# Patient Record
Sex: Male | Born: 1965 | Race: Black or African American | Hispanic: No | Marital: Married | Smoking: Current some day smoker
Health system: Southern US, Community
[De-identification: ages and names within clinical notes are randomized; demographics above are authoritative.]

## PROBLEM LIST (undated history)

## (undated) DIAGNOSIS — E7800 Pure hypercholesterolemia, unspecified: Secondary | ICD-10-CM

## (undated) DIAGNOSIS — J45909 Unspecified asthma, uncomplicated: Secondary | ICD-10-CM

## (undated) DIAGNOSIS — E785 Hyperlipidemia, unspecified: Secondary | ICD-10-CM

## (undated) HISTORY — DX: Unspecified asthma, uncomplicated: J45.909

## (undated) HISTORY — DX: Hyperlipidemia, unspecified: E78.5

---

## 2000-10-21 ENCOUNTER — Emergency Department (HOSPITAL_COMMUNITY): Admission: EM | Admit: 2000-10-21 | Discharge: 2000-10-21 | Payer: Self-pay | Admitting: Emergency Medicine

## 2001-01-21 ENCOUNTER — Emergency Department (HOSPITAL_COMMUNITY): Admission: EM | Admit: 2001-01-21 | Discharge: 2001-01-21 | Payer: Self-pay | Admitting: Emergency Medicine

## 2001-06-21 ENCOUNTER — Encounter: Payer: Self-pay | Admitting: Emergency Medicine

## 2001-06-21 ENCOUNTER — Emergency Department (HOSPITAL_COMMUNITY): Admission: EM | Admit: 2001-06-21 | Discharge: 2001-06-21 | Payer: Self-pay | Admitting: Emergency Medicine

## 2001-08-13 ENCOUNTER — Emergency Department (HOSPITAL_COMMUNITY): Admission: EM | Admit: 2001-08-13 | Discharge: 2001-08-13 | Payer: Self-pay | Admitting: Emergency Medicine

## 2002-04-22 ENCOUNTER — Emergency Department (HOSPITAL_COMMUNITY): Admission: EM | Admit: 2002-04-22 | Discharge: 2002-04-22 | Payer: Self-pay | Admitting: Emergency Medicine

## 2005-08-08 ENCOUNTER — Emergency Department (HOSPITAL_COMMUNITY): Admission: EM | Admit: 2005-08-08 | Discharge: 2005-08-08 | Payer: Self-pay | Admitting: Emergency Medicine

## 2006-10-03 ENCOUNTER — Emergency Department (HOSPITAL_COMMUNITY): Admission: EM | Admit: 2006-10-03 | Discharge: 2006-10-03 | Payer: Self-pay | Admitting: Emergency Medicine

## 2010-01-16 ENCOUNTER — Ambulatory Visit
Admission: RE | Admit: 2010-01-16 | Discharge: 2010-01-16 | Payer: Self-pay | Source: Home / Self Care | Attending: Pulmonary Disease | Admitting: Pulmonary Disease

## 2010-01-16 DIAGNOSIS — J45909 Unspecified asthma, uncomplicated: Secondary | ICD-10-CM | POA: Insufficient documentation

## 2010-02-13 NOTE — Assessment & Plan Note (Signed)
Summary: asthma/jd   Copy to:  Self referral  CC:  Asthma evaluation.  History of Present Illness: 45 yo male for evaluation of asthma.  He has suffered from asthma his whole life.  He gets flairs whenever he gets a cold.  He had a cold in October, and hasn't recovered since.  He has to go to the ER a lot, and frequently gets course of prednisone.  He does not have a regular doctor.  He was last on prednisone about 7 months ago.  He has been using an old albuterol inhaler.  This helps, but does not last.  He has been on several other inhalers before, but does not remember what these were.  He does not have problem with allergies.  His sinuses have been okay.  He has not had problem with his throat, and denies gland swelling.  He denies reflux or abdominal symptoms.  He has not been having fever, or hemoptysis.  He denies skin rash.  He does not have any food or medication allergies.    He occasional smokes a cigar.  He works driving a Chief Executive Officer.  He denies history of pneumonia or TB.  He does not have any animal exposure.  He has not had any sick exposures.  He doesn't remember when he last had a chest xray or breathing tests.  Preventive Screening-Counseling & Management  Alcohol-Tobacco     Alcohol drinks/day: <1     Cigars/week: 1  Current Medications (verified): 1)  Ventolin Hfa 108 (90 Base) Mcg/act Aers (Albuterol Sulfate) .Marland Kitchen.. 1-2 Puffs As Needed 2)  Vitamin C 500 Mg Tabs (Ascorbic Acid) .Marland Kitchen.. 1 By Mouth Daily 3)  Multivitamins  Tabs (Multiple Vitamin) .Marland Kitchen.. 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  The patient denies productive cough, coughing up blood, irregular heartbeats, acid heartburn, loss of appetite, weight change, abdominal pain, difficulty swallowing, sore throat, tooth/dental problems, headaches, nasal congestion/difficulty breathing through nose, sneezing, itching, ear ache, anxiety, depression, hand/feet swelling, joint stiffness or pain, rash, change in color of mucus, and fever.    Vital Signs:  Patient profile:   45 year old male Height:      70 inches (177.80 cm) Weight:      211 pounds (95.91 kg) BMI:     30.38 O2 Sat:      92 % on Room air Temp:     98.3 degrees F (36.83 degrees C) oral Pulse rate:   78 / minute BP sitting:   124 / 78  (left arm) Cuff size:   large  Vitals Entered By: Michel Bickers CMA (January 16, 2010 4:48 PM)  O2 Sat at Rest %:  92 O2 Flow:  Room air CC: Asthma evaluation Is Patient Diabetic? No Comments Medications reviewed with patient Michel Bickers CMA  January 16, 2010 4:49 PM   Physical Exam  General:  normal appearance and dyspneic.   Eyes:  PERRLA and EOMI.   Ears:  TMs intact and clear with normal canals Nose:  clear nasal discharge.   Mouth:  2+ tonsills, mild erythema, no exudate Neck:  no JVD.   Chest Wall:  no deformities noted Lungs:  prolonged exhalation, diffuse b/l exp wheeze improved after nebulizer treatment Heart:  regular rate and rhythm, S1, S2 without murmurs, rubs, gallops, or clicks Abdomen:  bowel sounds positive; abdomen soft and non-tender without masses, or organomegaly Msk:  no deformity or scoliosis noted with normal posture Pulses:  pulses normal Extremities:  no clubbing, cyanosis, edema, or deformity noted Neurologic:  normal CN II-XII and strength normal.   Skin:  intact without lesions or rashes Cervical Nodes:  no significant adenopathy Psych:  alert and cooperative; normal mood and affect;  normal attention span and concentration   Impression & Recommendations:  Problem # 1:  ASTHMA (ICD-493.90) He has an extensive history of asthma with frequent exacerbations requiring emergency room visits.  He has intermittent tobacco use.  He has an acute exacerbation currently.  He had symptomatic and clinical improvement from nebulizer treatment today.    I will give him a short course of prednisone.  I will start him on Qvar.  He can use ventolin as needed.  I will also arrange for him to get a home nebulizer.  I explained to him that asthma is a controllable disorder provided he remains compliant with therapy and follow up.  Emphasized the need to avoid all tobacco products.  Also explained the adverse health effects of recurrent prednisone use.  At his next follow up will arrange for pulmonary function testing.  Will also need to address vaccinations at next visit.  Medications Added to Medication List This Visit: 1)  Qvar 80 Mcg/act Aers (Beclomethasone dipropionate) .... Two puffs two times a day 2)  Ventolin Hfa 108 (90 Base) Mcg/act Aers (Albuterol sulfate) .Marland Kitchen.. 1-2 puffs as needed 3)  Albuterol Sulfate (2.5 Mg/68ml) 0.083% Nebu (Albuterol sulfate) .... One vial nebulized four times per day as needed 4)  Home Nebulizer  .... Use as directed 5)  Vitamin C 500 Mg Tabs (Ascorbic acid) .Marland Kitchen.. 1 by mouth daily 6)  Multivitamins Tabs (Multiple vitamin) .Marland Kitchen.. 1 by mouth daily 7)  Prednisone 10 Mg Tabs (Prednisone) .... 2 pills for 2 days, 1 pill for 2 days, 1/2 pill for 2 days  Complete Medication List: 1)  Qvar 80 Mcg/act Aers (Beclomethasone dipropionate) .... Two puffs two times a day 2)  Ventolin Hfa 108 (90 Base) Mcg/act Aers (Albuterol sulfate) .Marland Kitchen.. 1-2 puffs as needed 3)  Albuterol Sulfate (2.5 Mg/6ml) 0.083% Nebu (Albuterol sulfate) .... One vial nebulized four times per day as needed 4)  Home Nebulizer  .... Use as directed 5)  Vitamin C 500 Mg Tabs (Ascorbic acid) .Marland Kitchen.. 1 by mouth  daily 6)  Multivitamins Tabs (Multiple vitamin) .Marland Kitchen.. 1 by mouth daily 7)  Prednisone 10 Mg Tabs (Prednisone) .... 2 pills for 2 days, 1 pill for 2 days, 1/2 pill for 2 days  Other Orders: Nebulizer Tx (82956) Consultation Level IV (21308) DME Referral (DME)  Patient Instructions: 1)  Prednisone 10 mg pills: 2 pills for 2 days, 1 pill for 2 days, 1/2 pill for 2 days 2)  Qvar two puffs two times a day, and rinse mouth after using 3)  Ventolin two puffs up to four times per day as needed for cough, wheeze, chest congestion, or shortness of breath 4)  Will arrange for home nebulizer 5)  Albuterol one vial nebulized up to four times per day as needed for cough,  wheeze, chest congestion, or shortness of breath 6)  Follow up in 4 to 6 weeks Prescriptions: HOME NEBULIZER use as directed  #1 x 0   Entered and Authorized by:   Coralyn Helling MD   Signed by:   Coralyn Helling MD on 01/16/2010   Method used:   Print then Give to Patient   RxID:   [id number] PREDNISONE 10 MG TABS (PREDNISONE) 2 pills for 2 days, 1 pill for 2 days, 1/2 pill for 2 days  #7 x 0   Entered and Authorized by:   Coralyn Helling MD   Signed by:   Coralyn Helling MD on 01/16/2010   Method used:   Print then Give to Patient   RxID:   [id number] VENTOLIN HFA 108 (90 BASE) MCG/ACT AERS (ALBUTEROL SULFATE) 1-2 puffs as needed  #0 x 0   Entered and Authorized by:   Coralyn Helling MD   Signed by:   Coralyn Helling MD on 01/16/2010   Method used:   Print then Give to Patient   RxID:   [id number] ALBUTEROL SULFATE (2.5 MG/3ML) 0.083% NEBU (ALBUTEROL SULFATE) one vial nebulized four times per day as needed  #120 x 6   Entered and Authorized by:   Coralyn Helling MD   Signed by:   Coralyn Helling MD on 01/16/2010   Method used:   Print then Give to Patient   RxID:   [id number] QVAR 80 MCG/ACT AERS (BECLOMETHASONE DIPROPIONATE) two puffs two times a day  #1 x 6   Entered and Authorized by:   Coralyn Helling MD   Signed by:   Coralyn Helling  MD on 01/16/2010   Method used:   Print then Give to Patient   RxID:   [id number]    Medication Administration  Medication # 1:    Medication: Xopenex 1.25mg     Diagnosis: ASTHMA (ICD-493.90)    Route: inhaled    Exp Date: 08/2010    Lot #: [id number]    Mfr: Sepracor    Patient tolerated medication without complications    Given by: Michel Bickers CMA (January 16, 2010 5:04 PM)  Orders Added: 1)  Nebulizer Tx [60630] 2)  Consultation Level IV [16010] 3)  DME Referral [DME]

## 2010-02-21 ENCOUNTER — Ambulatory Visit: Payer: Self-pay | Admitting: Pulmonary Disease

## 2010-02-24 ENCOUNTER — Telehealth: Payer: Self-pay | Admitting: Pulmonary Disease

## 2010-03-05 NOTE — Progress Notes (Signed)
Summary: nos appt  Phone Note Call from Patient   Caller: [email or web address]  Call For: Granger Chui Summary of Call: In ref to nos from 2/10 pt states he will call to rsc. Initial call taken by: Darletta Moll,  February 24, 2010 9:28 AM

## 2010-03-20 ENCOUNTER — Telehealth (INDEPENDENT_AMBULATORY_CARE_PROVIDER_SITE_OTHER): Payer: Self-pay | Admitting: *Deleted

## 2010-03-21 ENCOUNTER — Encounter: Payer: Self-pay | Admitting: Pulmonary Disease

## 2010-03-25 NOTE — Progress Notes (Signed)
Summary: prednisone  Phone Note Call from Patient Call back at [phone]   Caller: Patient Call For: sood Reason for Call: Talk to Nurse Summary of Call: Patient calling asking why prednisone rx was denied. Initial call taken by: Lehman Prom,  March 20, 2010 8:43 AM  Follow-up for Phone Call        Prednisone refill denied because it was given as a tapering dose in Jan 2012. Pt says he is wheezing more and his inhalers are not helping for any length of time. SOB come and goes. Pt informed VS is not in the office and he would need OV w/ another provider if needing RX for the Prednisone. Pt was offered appt today w/ TP @ 4:30 or OV today w/ KC @ 11:15. Pt refused and said he would have to talk with employer to see if he could get out of work. Pt stated he would callback once he has spoke with employer. Follow-up by: Michel Bickers CMA,  March 20, 2010 9:26 AM

## 2010-04-01 ENCOUNTER — Ambulatory Visit: Payer: Self-pay | Admitting: Pulmonary Disease

## 2010-06-05 ENCOUNTER — Other Ambulatory Visit: Payer: Self-pay | Admitting: Pulmonary Disease

## 2010-10-17 ENCOUNTER — Telehealth: Payer: Self-pay | Admitting: Pulmonary Disease

## 2010-10-17 ENCOUNTER — Other Ambulatory Visit: Payer: Self-pay | Admitting: Pulmonary Disease

## 2010-10-17 NOTE — Telephone Encounter (Signed)
Pt aware rx was sent and apt made for 10/29 at 9:15 for ov

## 2010-11-10 ENCOUNTER — Ambulatory Visit: Payer: Self-pay | Admitting: Pulmonary Disease

## 2011-01-23 ENCOUNTER — Other Ambulatory Visit: Payer: Self-pay | Admitting: Pulmonary Disease

## 2011-03-20 ENCOUNTER — Other Ambulatory Visit: Payer: Self-pay | Admitting: Pulmonary Disease

## 2012-06-13 ENCOUNTER — Other Ambulatory Visit: Payer: Self-pay | Admitting: Pulmonary Disease

## 2012-06-30 ENCOUNTER — Other Ambulatory Visit: Payer: Self-pay | Admitting: Pulmonary Disease

## 2014-07-23 ENCOUNTER — Encounter: Payer: Self-pay | Admitting: Cardiovascular Disease

## 2014-08-15 ENCOUNTER — Ambulatory Visit (INDEPENDENT_AMBULATORY_CARE_PROVIDER_SITE_OTHER): Payer: 59 | Admitting: Cardiovascular Disease

## 2014-08-15 ENCOUNTER — Encounter: Payer: Self-pay | Admitting: Cardiovascular Disease

## 2014-08-15 VITALS — BP 128/80 | HR 62 | Ht 72.0 in | Wt 219.5 lb

## 2014-08-15 DIAGNOSIS — E785 Hyperlipidemia, unspecified: Secondary | ICD-10-CM | POA: Insufficient documentation

## 2014-08-15 DIAGNOSIS — Z79899 Other long term (current) drug therapy: Secondary | ICD-10-CM

## 2014-08-15 NOTE — Assessment & Plan Note (Signed)
Bryan Lewis has hyperlipidemia on Crestor followed by his PCP. At his request, I am going to check a lipid and liver profile

## 2014-08-15 NOTE — Progress Notes (Signed)
     08/15/2014 Bryan Lewis   10/09/1965  [id number]  Primary Physician Geraldo Pitter, MD Primary Cardiologist: Runell Gess MD [facility]   HPI:  Mr. Loh is a 49 year old fit-appearing married African-American male father of one daughter referred by Dr. Parke Simmers for cardiovascular evaluation. He works as a Charity fundraiser.he has a history of hyperlipidemia but otherwise no cardiovascular risk factors. No matter how tight or stroke. Denies chest pain or shortness of breath. He drinks socially. He is on Crestor for hypovolemia which Dr. Parke Simmers follows. He is here to be established because of his factors.   Current Outpatient Prescriptions  Medication Sig Dispense Refill  . Ascorbic Acid (VITAMIN C) 500 MG tablet Take 500 mg by mouth daily.      . beclomethasone (QVAR) 80 MCG/ACT inhaler 2 puffs 2 times a day     . Multiple Vitamin (MULTIVITAMIN PO) 1 by mouth daily     . rosuvastatin (CRESTOR) 10 MG tablet Take 10 mg by mouth daily.    Marland Kitchen albuterol (PROVENTIL) (2.5 MG/3ML) 0.083% nebulizer solution USE 1 VIAL IN NEBULIZER FOUR TIMES DAILY AS NEEDED 360 mL 1  . VENTOLIN HFA 108 (90 BASE) MCG/ACT inhaler USE 1 TO 2 PUFFS BY MOUTH AS DIRECTED AS NEEDED 1 Inhaler 0   No current facility-administered medications for this visit.    No Known Allergies  History   Social History  . Marital Status: Single    Spouse Name: N/A  . Number of Children: 1  . Years of Education: N/A   Occupational History  . Estate agent    Social History Main Topics  . Smoking status: Current Some Day Smoker    Types: Cigars  . Smokeless tobacco: Not on file  . Alcohol Use: 0.5 oz/week    1 drink(s) per week  . Drug Use: Not on file  . Sexual Activity: Not on file   Other Topics Concern  . Not on file   Social History Narrative     Review of Systems: General: negative for chills, fever, night sweats or weight changes.  Cardiovascular: negative for chest pain, dyspnea on  exertion, edema, orthopnea, palpitations, paroxysmal nocturnal dyspnea or shortness of breath Dermatological: negative for rash Respiratory: negative for cough or wheezing Urologic: negative for hematuria Abdominal: negative for nausea, vomiting, diarrhea, bright red blood per rectum, melena, or hematemesis Neurologic: negative for visual changes, syncope, or dizziness All other systems reviewed and are otherwise negative except as noted above.    Blood pressure 128/80, pulse 62, height 6' (1.829 m), weight 219 lb 8 oz (99.565 kg).  General appearance: alert and no distress Neck: no adenopathy, no carotid bruit, no JVD, supple, symmetrical, trachea midline and thyroid not enlarged, symmetric, no tenderness/mass/nodules Lungs: clear to auscultation bilaterally Heart: regular rate and rhythm, S1, S2 normal, no murmur, click, rub or gallop Extremities: extremities normal, atraumatic, no cyanosis or edema  EKG normal sinus rhythm at 62 without ST or T-wave changes. I personally reviewed this EKG  ASSESSMENT AND PLAN:   Hyperlipidemia Mr. Clauson has hyperlipidemia on Crestor followed by his PCP. At his request, I am going to check a lipid and liver profile      Runell Gess MD [facility], [facility] 08/15/2014 12:26 PM

## 2014-08-15 NOTE — Patient Instructions (Addendum)
Your physician recommends that you return for lab work at your earliest convenience - FASTING.  Dr Allyson Sabal will follow-up with you as needed.

## 2014-08-18 LAB — HEPATIC FUNCTION PANEL
ALK PHOS: 58 U/L (ref 40–115)
ALT: 29 U/L (ref 9–46)
AST: 23 U/L (ref 10–40)
Albumin: 4.2 g/dL (ref 3.6–5.1)
BILIRUBIN DIRECT: 0.1 mg/dL (ref <=0.2)
BILIRUBIN INDIRECT: 0.5 mg/dL (ref 0.2–1.2)
TOTAL PROTEIN: 7.8 g/dL (ref 6.1–8.1)
Total Bilirubin: 0.6 mg/dL (ref 0.2–1.2)

## 2014-08-18 LAB — LIPID PANEL
Cholesterol: 215 mg/dL — ABNORMAL HIGH (ref 125–200)
HDL: 67 mg/dL (ref >=40)
LDL CALC: 137 mg/dL — AB (ref <130)
TRIGLYCERIDES: 53 mg/dL (ref <150)
Total CHOL/HDL Ratio: 3.2 Ratio (ref <=5.0)
VLDL: 11 mg/dL (ref <30)

## 2014-08-21 ENCOUNTER — Encounter: Payer: Self-pay | Admitting: *Deleted

## 2014-08-21 ENCOUNTER — Other Ambulatory Visit: Payer: Self-pay | Admitting: *Deleted

## 2014-08-21 ENCOUNTER — Telehealth: Payer: Self-pay | Admitting: *Deleted

## 2014-08-21 DIAGNOSIS — E785 Hyperlipidemia, unspecified: Secondary | ICD-10-CM

## 2014-08-21 DIAGNOSIS — Z79899 Other long term (current) drug therapy: Secondary | ICD-10-CM

## 2014-08-21 MED ORDER — ROSUVASTATIN CALCIUM 10 MG PO TABS: 10.0000 mg | ORAL_TABLET | Freq: Every day | ORAL | Status: AC

## 2014-08-21 NOTE — Telephone Encounter (Signed)
Lab slip mailed.

## 2014-08-21 NOTE — Telephone Encounter (Signed)
-----   Message from Runell Gess, MD sent at 08/19/2014  4:31 PM EDT ----- Increase Crestor to 20 mg and re check

## 2014-09-19 ENCOUNTER — Telehealth: Payer: Self-pay | Admitting: *Deleted

## 2014-09-19 NOTE — Telephone Encounter (Signed)
Patient walk-in to get lab results from two weeks ago. Printed him out a copy and provided education regarding results, including medication regimen, lab follow up and diet. Patient verbalized understanding and appreciation for information. Pt will return in mid-October to get repeat labs which are already ordered in the system.

## 2016-03-25 DIAGNOSIS — M542 Cervicalgia: Secondary | ICD-10-CM | POA: Diagnosis not present

## 2016-03-25 DIAGNOSIS — E785 Hyperlipidemia, unspecified: Secondary | ICD-10-CM | POA: Diagnosis not present

## 2016-04-08 DIAGNOSIS — Z800 Family history of malignant neoplasm of digestive organs: Secondary | ICD-10-CM | POA: Diagnosis not present

## 2016-04-30 ENCOUNTER — Encounter: Payer: Self-pay | Admitting: Cardiovascular Disease

## 2016-04-30 ENCOUNTER — Ambulatory Visit (INDEPENDENT_AMBULATORY_CARE_PROVIDER_SITE_OTHER): Payer: 59 | Admitting: Cardiovascular Disease

## 2016-04-30 VITALS — BP 122/80 | Ht 72.0 in | Wt 224.0 lb

## 2016-04-30 DIAGNOSIS — Z79899 Other long term (current) drug therapy: Secondary | ICD-10-CM

## 2016-04-30 DIAGNOSIS — E785 Hyperlipidemia, unspecified: Secondary | ICD-10-CM | POA: Diagnosis not present

## 2016-04-30 NOTE — Patient Instructions (Addendum)
Medication Instructions:  Your physician recommends that you continue on your current medications as directed. Please refer to the Current Medication list given to you today.  Labwork: Please have FASTING lab work today (Lipid, hepatic) at [facility]-no appointment needed.  Testing/Procedures: NONE  Follow-Up: Your physician wants you to follow-up in: 1 YEAR with Dr. Allyson Sabal. You will receive a reminder letter in the mail two months in advance. If you don't receive a letter, please call our office to schedule the follow-up appointment.   Any Other Special Instructions Will Be Listed Below (If Applicable).     If you need a refill on your cardiac medications before your next appointment, please call your pharmacy.

## 2016-04-30 NOTE — Progress Notes (Signed)
     04/30/2016 Duwayne Heck   [birth year]  [id number]  Primary Physician Geraldo Pitter, MD Primary Cardiologist: Runell Gess MD [facility] Reno  HPI:  Mr. Ala is a 51 year old fit-appearing married African-American male father of one daughter referred by Dr. Parke Simmers for cardiovascular evaluation. I last saw him in the office 08/15/14. He works as a Charity fundraiser.He has a history of hyperlipidemia but otherwise no cardiovascular risk factors. There is no history of heart attack or stroke. He denies chest pain or shortness of breath. He drinks socially. He is on Crestor for hyperlipidemia which Dr. Parke Simmers follows. His main complaint is pain in the back of his head which occurs on a daily basis, last for hours at a time. I reassured him that this does not sound cardiac.  Current Outpatient Prescriptions  Medication Sig Dispense Refill  . albuterol (PROVENTIL) (2.5 MG/3ML) 0.083% nebulizer solution USE 1 VIAL IN NEBULIZER FOUR TIMES DAILY AS NEEDED 360 mL 1  . Ascorbic Acid (VITAMIN C) 500 MG tablet Take 500 mg by mouth daily.      . Multiple Vitamin (MULTIVITAMIN PO) 1 by mouth daily     . rosuvastatin (CRESTOR) 10 MG tablet Take 1 tablet (10 mg total) by mouth daily. 90 tablet 3  . VENTOLIN HFA 108 (90 BASE) MCG/ACT inhaler USE 1 TO 2 PUFFS BY MOUTH AS DIRECTED AS NEEDED 1 Inhaler 0   No current facility-administered medications for this visit.     No Known Allergies  Social History   Social History  . Marital status: Single    Spouse name: N/A  . Number of children: 1  . Years of education: N/A   Occupational History  . Estate agent [employer]   Social History Main Topics  . Smoking status: Current Some Day Smoker    Types: Cigars  . Smokeless tobacco: Never Used  . Alcohol use 0.5 oz/week    1 Standard drinks or equivalent per week  . Drug use: Unknown  . Sexual activity: Not on file   Other Topics Concern  . Not on file   Social History Narrative    . No narrative on file     Review of Systems: General: negative for chills, fever, night sweats or weight changes.  Cardiovascular: negative for chest pain, dyspnea on exertion, edema, orthopnea, palpitations, paroxysmal nocturnal dyspnea or shortness of breath Dermatological: negative for rash Respiratory: negative for cough or wheezing Urologic: negative for hematuria Abdominal: negative for nausea, vomiting, diarrhea, bright red blood per rectum, melena, or hematemesis Neurologic: negative for visual changes, syncope, or dizziness All other systems reviewed and are otherwise negative except as noted above.    Blood pressure 122/80, height 6' (1.829 m), weight 224 lb (101.6 kg).  General appearance: alert and no distress Neck: no adenopathy, no carotid bruit, no JVD, supple, symmetrical, trachea midline and thyroid not enlarged, symmetric, no tenderness/mass/nodules Lungs: clear to auscultation bilaterally Heart: regular rate and rhythm, S1, S2 normal, no murmur, click, rub or gallop Extremities: extremities normal, atraumatic, no cyanosis or edema  EKG normal sinus rhythm at 73 without ST or T-wave changes. I personally reviewed this EKG  ASSESSMENT AND PLAN:   Hyperlipidemia History of hyperlipidemia on Crestor. We will recheck a lipid and liver profile.      Runell Gess MD FACP,FACC,FAHA, [facility] 04/30/2016 11:25 AM

## 2016-04-30 NOTE — Assessment & Plan Note (Signed)
History of hyperlipidemia on Crestor. We will recheck a lipid and liver profile 

## 2016-05-01 ENCOUNTER — Ambulatory Visit: Payer: 59 | Admitting: Cardiovascular Disease

## 2016-05-18 DIAGNOSIS — H40013 Open angle with borderline findings, low risk, bilateral: Secondary | ICD-10-CM | POA: Diagnosis not present

## 2016-10-13 DIAGNOSIS — B353 Tinea pedis: Secondary | ICD-10-CM | POA: Diagnosis not present

## 2016-10-13 DIAGNOSIS — Z0000 Encounter for general adult medical examination without abnormal findings: Secondary | ICD-10-CM | POA: Diagnosis not present

## 2016-12-07 ENCOUNTER — Other Ambulatory Visit: Payer: Self-pay

## 2016-12-07 ENCOUNTER — Encounter: Payer: Self-pay | Admitting: Emergency Medicine

## 2016-12-07 ENCOUNTER — Emergency Department
Admission: EM | Admit: 2016-12-07 | Discharge: 2016-12-07 | Disposition: A | Discharge status: Home / Self Care | Payer: 59 | Attending: Emergency Medicine | Admitting: Emergency Medicine

## 2016-12-07 ENCOUNTER — Emergency Department: Payer: 59

## 2016-12-07 DIAGNOSIS — J45909 Unspecified asthma, uncomplicated: Secondary | ICD-10-CM | POA: Diagnosis not present

## 2016-12-07 DIAGNOSIS — G44209 Tension-type headache, unspecified, not intractable: Secondary | ICD-10-CM | POA: Diagnosis not present

## 2016-12-07 DIAGNOSIS — F17290 Nicotine dependence, other tobacco product, uncomplicated: Secondary | ICD-10-CM | POA: Diagnosis not present

## 2016-12-07 DIAGNOSIS — Z79899 Other long term (current) drug therapy: Secondary | ICD-10-CM | POA: Diagnosis not present

## 2016-12-07 DIAGNOSIS — R51 Headache: Secondary | ICD-10-CM | POA: Diagnosis not present

## 2016-12-07 HISTORY — DX: Pure hypercholesterolemia, unspecified: E78.00

## 2016-12-07 MED ORDER — METHOCARBAMOL 500 MG PO TABS: 500.0000 mg | ORAL_TABLET | Freq: Four times a day (QID) | ORAL | 0 refills | Status: AC

## 2016-12-07 MED ORDER — BUTALBITAL-APAP-CAFFEINE 50-325-40 MG PO TABS: 1.0000 | ORAL_TABLET | Freq: Four times a day (QID) | ORAL | 0 refills | Status: AC | PRN

## 2016-12-07 MED ORDER — PROMETHAZINE HCL 25 MG/ML IJ SOLN
25.0000 mg | Freq: Once | INTRAMUSCULAR | Status: AC
  Administered 2016-12-07: 25 mg via INTRAMUSCULAR
  Filled 2016-12-07: qty 1

## 2016-12-07 MED ORDER — KETOROLAC TROMETHAMINE 30 MG/ML IJ SOLN
30.0000 mg | Freq: Once | INTRAMUSCULAR | Status: AC
  Administered 2016-12-07: 30 mg via INTRAMUSCULAR
  Filled 2016-12-07: qty 1

## 2016-12-07 MED ORDER — DIPHENHYDRAMINE HCL 50 MG/ML IJ SOLN
50.0000 mg | Freq: Once | INTRAMUSCULAR | Status: AC
  Administered 2016-12-07: 50 mg via INTRAMUSCULAR
  Filled 2016-12-07: qty 1

## 2016-12-07 NOTE — ED Triage Notes (Signed)
Wife states patient has been having "strange feeling in his head" X3 days. Wife denies neuro change

## 2016-12-07 NOTE — ED Notes (Signed)
Pt c/o pain in the top of his head that radiates to the back of his head - ha present 4 days - denies N/V - denies change in vision

## 2016-12-07 NOTE — ED Triage Notes (Signed)
Pt reports headache at back of head for one week. Denies nausea or vomiting. Denies visual changes. Denies history of migraines. Denies head injury. States took ibuprofen yesterday without relief. Denies taking blood thinners. Pt ambulatory to triage. No apparent distress noted. No neuro deficits noted.

## 2016-12-07 NOTE — ED Provider Notes (Signed)
[name] Emergency Department Provider Note  ____________________________________________  Time seen: Approximately 3:58 PM  I have reviewed the triage vital signs and the nursing notes.   HISTORY  Chief Complaint Headache    HPI Bryan Lewis is a 51 y.o. male who presents the emergency department complaining of occipital headache times 1 week.  Patient reports that he has had an occipital headache greater on the right and left side for a week.  No history of trauma.  No history of migraines or other headache syndromes.  Patient reports that it is both a tight and throbbing sensation.  Patient states that it is "like I got hit over the head with something and it just will getting better.  Patient is tried multiple over-the-counter medications with no relief.  He denies any visual changes, slurred speech, weakness, facial droop, decreased extremity strength.  No history of migraines, headache syndrome, TIA, CVA.  Patient is concerned as headache has not abated whatsoever in the past week.  He reports that it does make it hard to sleep due to the headache.  No other complaints at this time.  Past Medical History:  Diagnosis Date  . Elevated cholesterol   . Hyperlipidemia   . Unspecified asthma(493.90)     Patient Active Problem List   Diagnosis Date Noted  . Hyperlipidemia 08/15/2014  . ASTHMA 01/16/2010    No past surgical history on file.  Prior to Admission medications   Medication Sig Start Date End Date Taking? Authorizing Provider  albuterol (PROVENTIL) (2.5 MG/3ML) 0.083% nebulizer solution USE 1 VIAL IN NEBULIZER FOUR TIMES DAILY AS NEEDED 03/20/11 03/22/12  Coralyn Helling, MD  Ascorbic Acid (VITAMIN C) 500 MG tablet Take 500 mg by mouth daily.      [provider]  butalbital-acetaminophen-caffeine (FIORICET, ESGIC) 50-325-40 MG tablet Take 1-2 tablets by mouth every 6 (six) hours as needed for headache. 12/07/16 12/07/17  Derran Sear,  Delorise Royals, PA-C  methocarbamol (ROBAXIN) 500 MG tablet Take 1 tablet (500 mg total) by mouth 4 (four) times daily. 12/07/16   Marquese Burkland, Delorise Royals, PA-C  Multiple Vitamin (MULTIVITAMIN PO) 1 by mouth daily     [provider]  rosuvastatin (CRESTOR) 10 MG tablet Take 1 tablet (10 mg total) by mouth daily. 08/21/14   Runell Gess, MD  VENTOLIN HFA 108 (90 BASE) MCG/ACT inhaler USE 1 TO 2 PUFFS BY MOUTH AS DIRECTED AS NEEDED 10/17/10 10/17/11  Coralyn Helling, MD    Allergies Patient has no known allergies.  No family history on file.  Social History Social History   Tobacco Use  . Smoking status: Current Some Day Smoker    Types: Cigars  . Smokeless tobacco: Never Used  Substance Use Topics  . Alcohol use: Yes    Alcohol/week: 0.5 oz    Types: 1 Standard drinks or equivalent per week  . Drug use: Not on file     Review of Systems  Constitutional: No fever/chills Eyes: No visual changes. No discharge ENT: No upper respiratory complaints. Cardiovascular: no chest pain. Respiratory: no cough. No SOB. Gastrointestinal: No abdominal pain.  No nausea, no vomiting.  No diarrhea.  No constipation. Musculoskeletal: Negative for musculoskeletal pain. Skin: Negative for rash, abrasions, lacerations, ecchymosis. Neurological: Positive for occipital headache.  Denies focal weakness or numbness. 10-point ROS otherwise negative.  ____________________________________________   PHYSICAL EXAM:  VITAL SIGNS: ED Triage Vitals  Enc Vitals Group     BP 12/07/16 1523 (!) 144/92  Pulse Rate 12/07/16 1523 73     Resp 12/07/16 1523 20     Temp 12/07/16 1523 98.2 F (36.8 C)     Temp Source 12/07/16 1523 Oral     SpO2 12/07/16 1523 96 %     Weight 12/07/16 1524 235 lb (106.6 kg)     Height 12/07/16 1524 6' (1.829 m)     Head Circumference --      Peak Flow --      Pain Score 12/07/16 1528 7     Pain Loc --      Pain Edu? --      Excl. in GC? --      Constitutional:  Alert and oriented. Well appearing and in no acute distress. Eyes: Conjunctivae are normal. PERRL. EOMI. Head: Atraumatic. ENT:      Ears:       Nose: No congestion/rhinnorhea.      Mouth/Throat: Mucous membranes are moist.  Neck: No stridor.  No midline cervical spine tenderness to palpation.  Patient does have some spasms appreciated to the right paraspinal muscle group and trapezius muscle groups.  Radial pulse intact bilateral upper extremities.  Sensation intact and equal in all dermatomal distributions bilaterally. Hematological/Lymphatic/Immunilogical: No cervical lymphadenopathy. Cardiovascular: Normal rate, regular rhythm. Normal S1 and S2.  Good peripheral circulation. Respiratory: Normal respiratory effort without tachypnea or retractions. Lungs CTAB. Good air entry to the bases with no decreased or absent breath sounds. Musculoskeletal: Full range of motion to all extremities. No gross deformities appreciated. Neurologic:  Normal speech and language. No gross focal neurologic deficits are appreciated.  Cranial nerves II through XII grossly intact.  Negative Romberg's and pronator drift.  Equal grip strength bilateral upper extremities. Skin:  Skin is warm, dry and intact. No rash noted. Psychiatric: Mood and affect are normal. Speech and behavior are normal. Patient exhibits appropriate insight and judgement.   ____________________________________________   LABS (all labs ordered are listed, but only abnormal results are displayed)  Labs Reviewed - No data to display ____________________________________________  EKG   ____________________________________________  RADIOLOGY Festus BarrenI, Jaykub Mackins D Tyrica Afzal, personally viewed and evaluated these images as part of my medical decision making, as well as reviewing the written report by the radiologist.  Ct Head Wo Contrast  Result Date: 12/07/2016 CLINICAL DATA:  51 y/o  M; 4 days of severe posterior headaches. EXAM: CT HEAD WITHOUT  CONTRAST TECHNIQUE: Contiguous axial images were obtained from the base of the skull through the vertex without intravenous contrast. COMPARISON:  None. FINDINGS: Brain: No evidence of acute infarction, hemorrhage, hydrocephalus, extra-axial collection or mass lesion/mass effect. Few nonspecific small foci of hypoattenuation are present predominantly within subcortical white matter. Vascular: No hyperdense vessel or unexpected calcification. Skull: Normal. Negative for fracture or focal lesion. Sinuses/Orbits: No acute finding. Other: None. IMPRESSION: 1. No acute intracranial abnormality identified. 2. Few nonspecific foci of hypoattenuation in subcortical white matter probably representing mild chronic microvascular ischemic changes. Electronically Signed   By: Mitzi HansenLance  Furusawa-Stratton M.D.   On: 12/07/2016 16:45    ____________________________________________    PROCEDURES  Procedure(s) performed:    Procedures    Medications  ketorolac (TORADOL) 30 MG/ML injection 30 mg (not administered)  promethazine (PHENERGAN) injection 25 mg (not administered)  diphenhydrAMINE (BENADRYL) injection 50 mg (not administered)     ____________________________________________   INITIAL IMPRESSION / ASSESSMENT AND PLAN / ED COURSE  Pertinent labs & imaging results that were available during my care of the patient were reviewed by me and  considered in my medical decision making (see chart for details).  Review of the [place] CSRS was performed in accordance of the [facility] prior to dispensing any controlled drugs.     Patient's diagnosis is consistent with engine type headache.  Differential included migraine versus tension type headache versus head bleed versus mass.  CT scan returned with reassuring results.  No indication for further workup at this time.  Patient is given migraine cocktail for headache relief.. Patient will be discharged home with prescriptions for Fioricet and Robaxin should headache  return. Patient is to follow up with primary care as needed or otherwise directed. Patient is given ED precautions to return to the ED for any worsening or new symptoms.     ____________________________________________  FINAL CLINICAL IMPRESSION(S) / ED DIAGNOSES  Final diagnoses:  Tension headache      NEW MEDICATIONS STARTED DURING THIS VISIT:  ED Discharge Orders        Ordered    butalbital-acetaminophen-caffeine (FIORICET, ESGIC) 50-325-40 MG tablet  Every 6 hours PRN     12/07/16 1705    methocarbamol (ROBAXIN) 500 MG tablet  4 times daily     12/07/16 1705          This chart was dictated using voice recognition software/Dragon. Despite best efforts to proofread, errors can occur which can change the meaning. Any change was purely unintentional.    Racheal PatchesCuthriell, Lizvet Chunn D, PA-C 12/07/16 1706    Sharman CheekStafford, Phillip, MD 12/08/16 2241

## 2017-04-07 DIAGNOSIS — M542 Cervicalgia: Secondary | ICD-10-CM | POA: Diagnosis not present

## 2017-04-07 DIAGNOSIS — M5032 Other cervical disc degeneration, mid-cervical region, unspecified level: Secondary | ICD-10-CM | POA: Diagnosis not present

## 2017-04-14 DIAGNOSIS — Z125 Encounter for screening for malignant neoplasm of prostate: Secondary | ICD-10-CM | POA: Diagnosis not present

## 2017-04-14 DIAGNOSIS — I10 Essential (primary) hypertension: Secondary | ICD-10-CM | POA: Diagnosis not present

## 2017-04-14 DIAGNOSIS — E785 Hyperlipidemia, unspecified: Secondary | ICD-10-CM | POA: Diagnosis not present

## 2017-04-14 DIAGNOSIS — N400 Enlarged prostate without lower urinary tract symptoms: Secondary | ICD-10-CM | POA: Diagnosis not present

## 2017-04-17 ENCOUNTER — Emergency Department: Admission: EM | Admit: 2017-04-17 | Discharge: 2017-04-17 | Discharge status: Against Medical Advice | Payer: 59

## 2017-04-19 DIAGNOSIS — M5030 Other cervical disc degeneration, unspecified cervical region: Secondary | ICD-10-CM | POA: Diagnosis not present

## 2017-04-19 DIAGNOSIS — M542 Cervicalgia: Secondary | ICD-10-CM | POA: Diagnosis not present

## 2017-04-24 DIAGNOSIS — M542 Cervicalgia: Secondary | ICD-10-CM | POA: Diagnosis not present

## 2017-05-03 DIAGNOSIS — Z800 Family history of malignant neoplasm of digestive organs: Secondary | ICD-10-CM | POA: Diagnosis not present

## 2017-05-03 DIAGNOSIS — M542 Cervicalgia: Secondary | ICD-10-CM | POA: Diagnosis not present

## 2017-05-20 DIAGNOSIS — Z1211 Encounter for screening for malignant neoplasm of colon: Secondary | ICD-10-CM | POA: Diagnosis not present

## 2017-05-20 DIAGNOSIS — Z800 Family history of malignant neoplasm of digestive organs: Secondary | ICD-10-CM | POA: Diagnosis not present

## 2018-02-03 DIAGNOSIS — M76822 Posterior tibial tendinitis, left leg: Secondary | ICD-10-CM | POA: Diagnosis not present

## 2018-02-03 DIAGNOSIS — M19072 Primary osteoarthritis, left ankle and foot: Secondary | ICD-10-CM | POA: Diagnosis not present

## 2018-02-03 DIAGNOSIS — M722 Plantar fascial fibromatosis: Secondary | ICD-10-CM | POA: Diagnosis not present

## 2018-02-14 DIAGNOSIS — H40013 Open angle with borderline findings, low risk, bilateral: Secondary | ICD-10-CM | POA: Diagnosis not present

## 2018-02-28 DIAGNOSIS — B353 Tinea pedis: Secondary | ICD-10-CM | POA: Diagnosis not present

## 2018-02-28 DIAGNOSIS — E785 Hyperlipidemia, unspecified: Secondary | ICD-10-CM | POA: Diagnosis not present

## 2018-02-28 DIAGNOSIS — Z0000 Encounter for general adult medical examination without abnormal findings: Secondary | ICD-10-CM | POA: Diagnosis not present

## 2018-03-03 DIAGNOSIS — E785 Hyperlipidemia, unspecified: Secondary | ICD-10-CM | POA: Diagnosis not present

## 2018-03-03 DIAGNOSIS — Z125 Encounter for screening for malignant neoplasm of prostate: Secondary | ICD-10-CM | POA: Diagnosis not present

## 2018-03-03 DIAGNOSIS — Z0000 Encounter for general adult medical examination without abnormal findings: Secondary | ICD-10-CM | POA: Diagnosis not present

## 2018-08-28 IMAGING — CT CT HEAD W/O CM
3 series · 15 of 47 positions shown, 18 images · non-contrast
Comparison: None.

CLINICAL DATA: 51 y/o  M; 4 days of severe posterior headaches.

EXAM:
CT HEAD WITHOUT CONTRAST
TECHNIQUE: Contiguous axial images were obtained from the base of the skull
through the vertex without intravenous contrast.

[Series 2: head wo · axial · 0.46mm/px · z∈[+586,+716]mm · 9 of 32 slices shown, 12 images]
[im 3/32  brain]
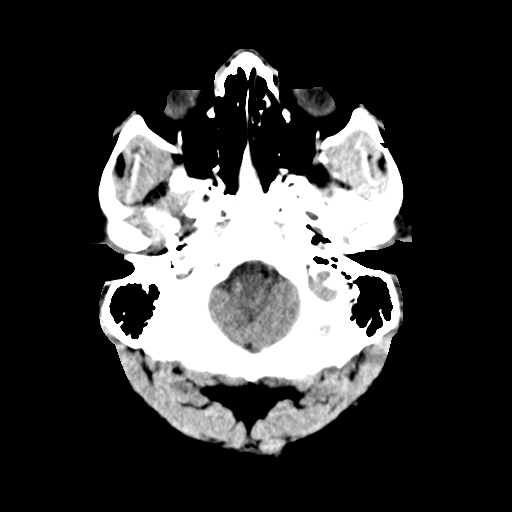
[im 3/32  bone]
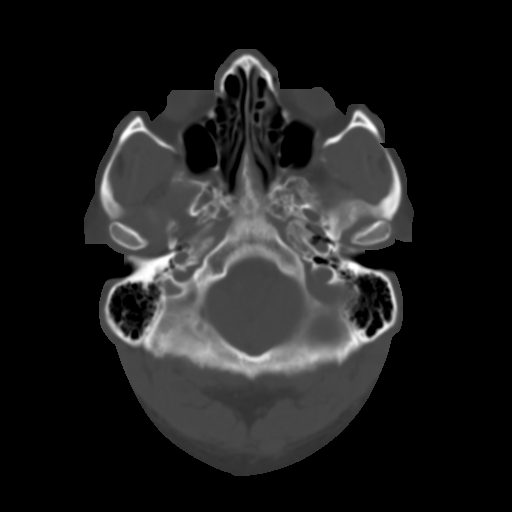
[im 6/32  brain]
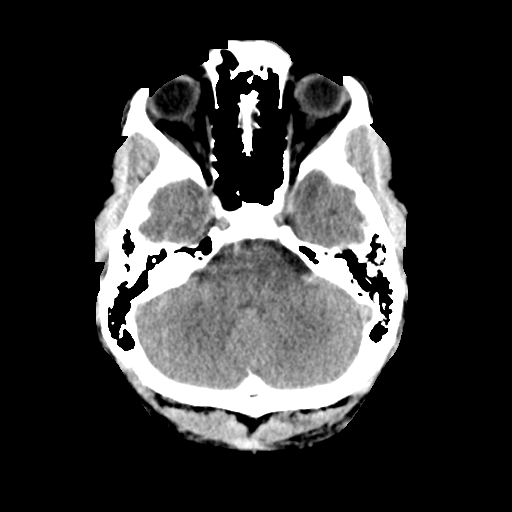
[im 9/32  brain]
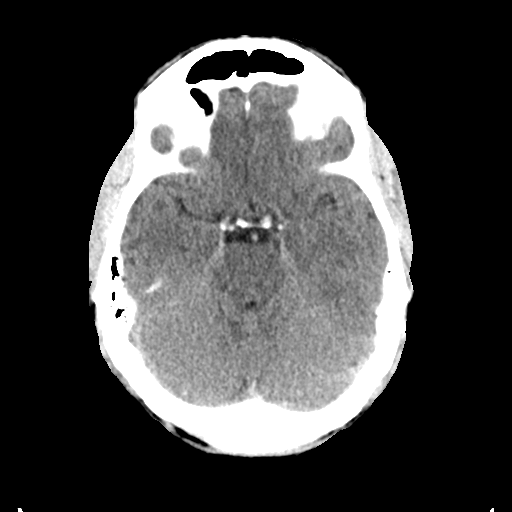
[im 12/32  brain]
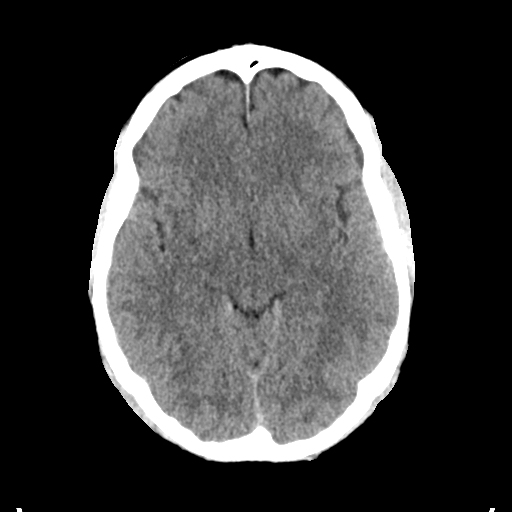
[im 17/32  brain]
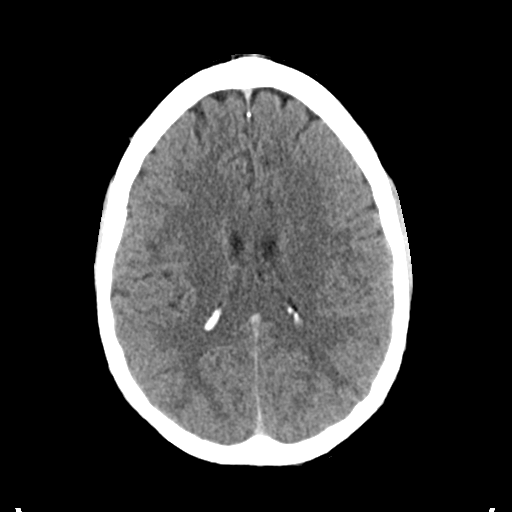
[im 17/32  bone]
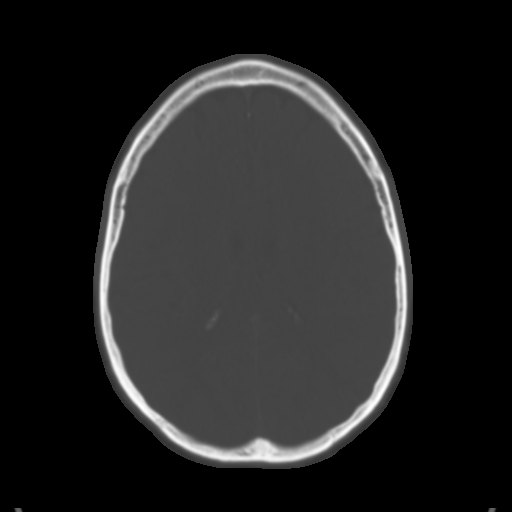
[im 20/32  brain]
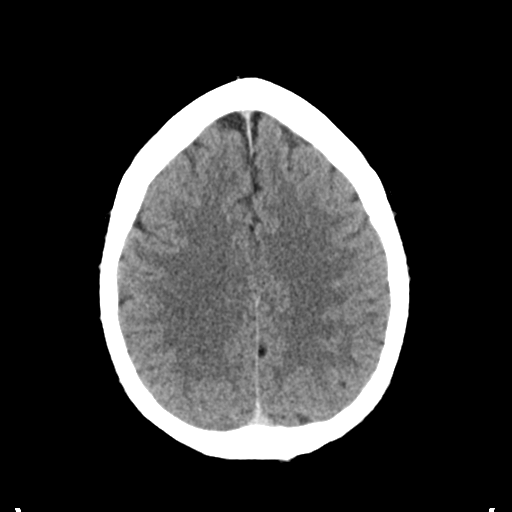
[im 23/32  brain]
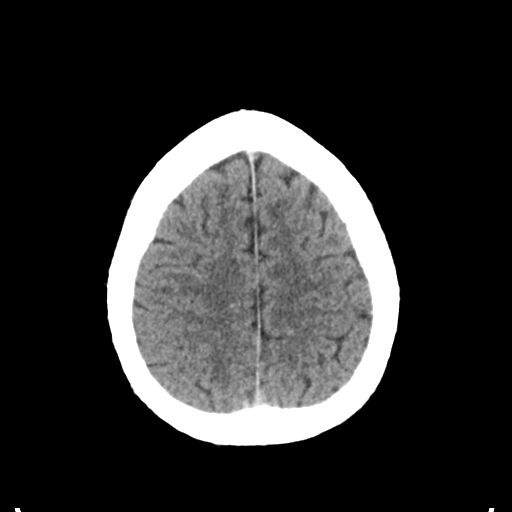
[im 26/32  brain]
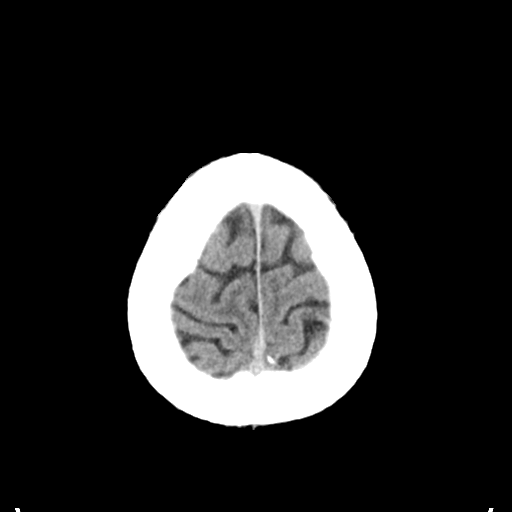
[im 29/32  brain]
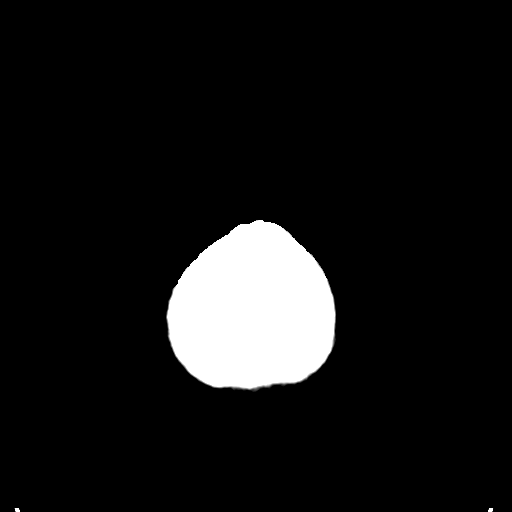
[im 29/32  bone]
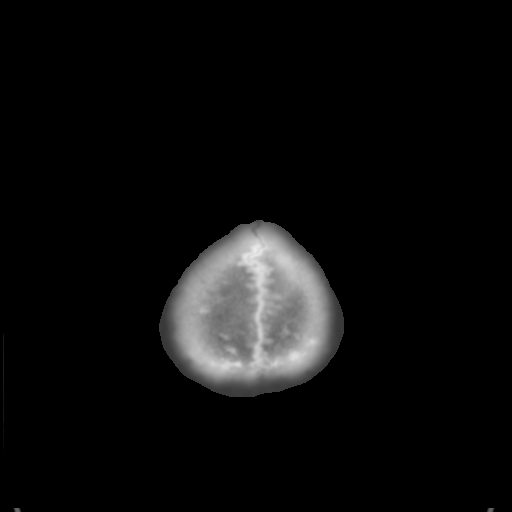

[Series 4: coronal soft tissue · coronal · 0.31mm/px · 3 of 65 slices shown]
[im 22/65  brain]
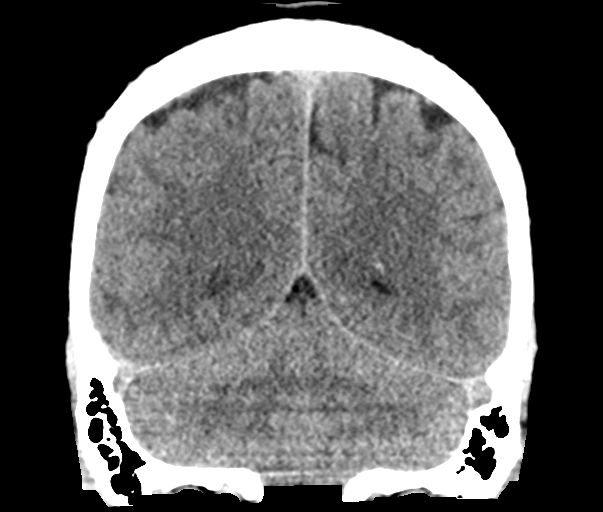
[im 29/65  brain]
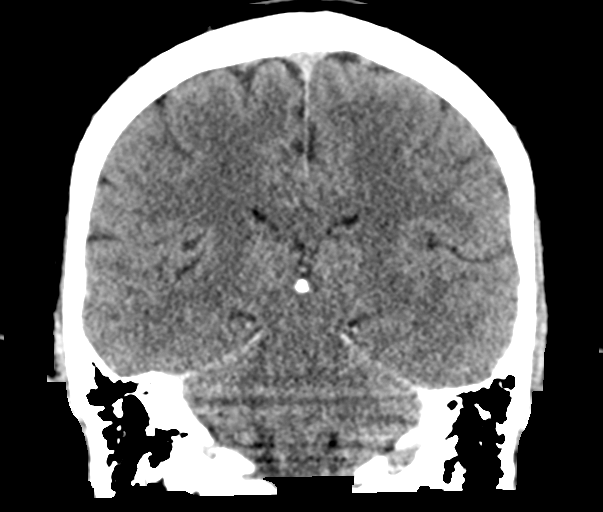
[im 36/65  brain]
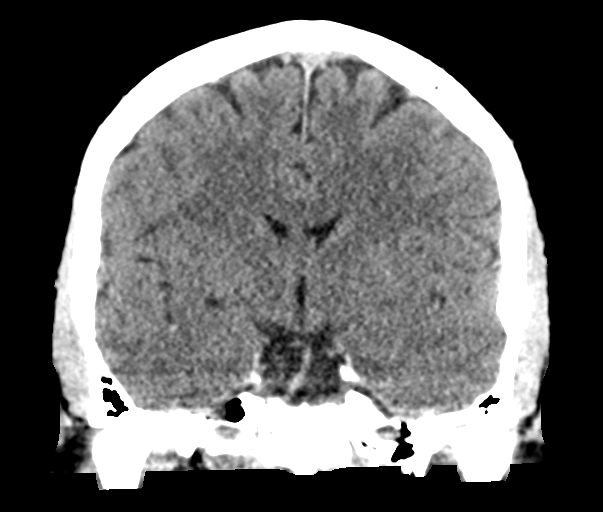

[Series 5: sagittal soft tissue · sagittal · 0.32mm/px · 3 of 53 slices shown]
[im 18/53  brain]
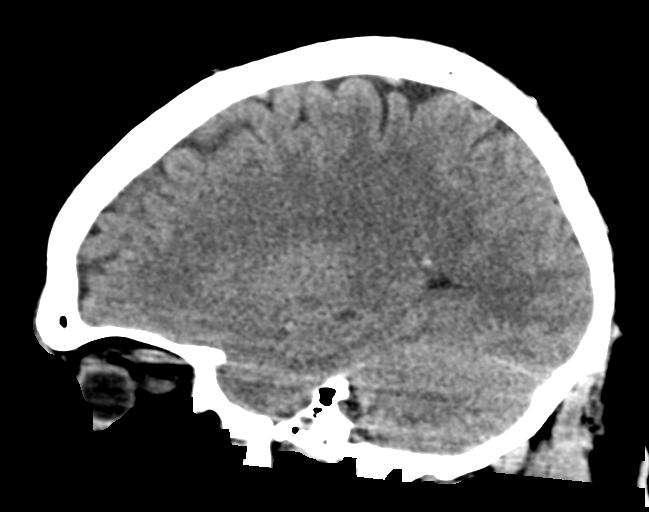
[im 27/53  brain]
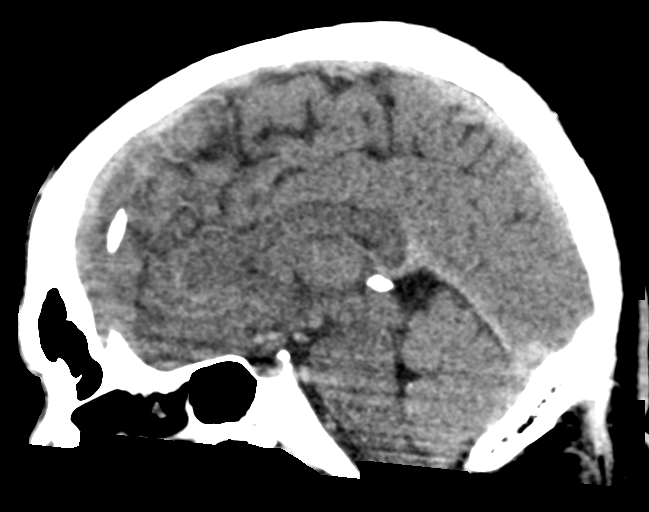
[im 35/53  brain]
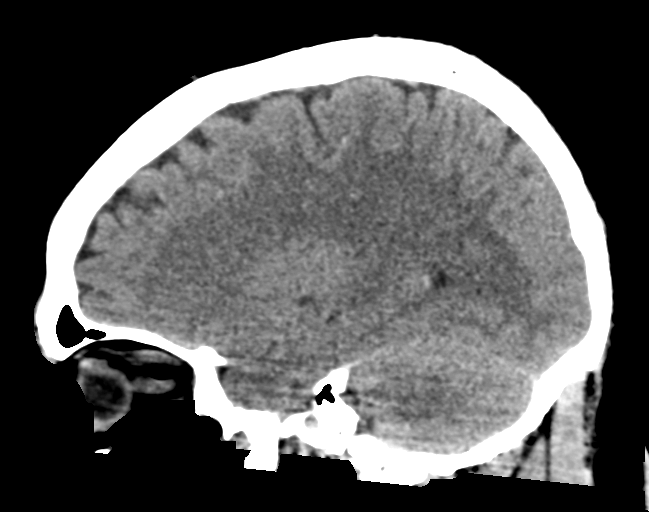

[15 of 47 positions shown; findings below may reference images not displayed]

FINDINGS: Brain: No evidence of acute infarction, hemorrhage, hydrocephalus,
extra-axial collection or mass lesion/mass effect. Few nonspecific
small foci of hypoattenuation are present predominantly within
subcortical white matter.

Vascular: No hyperdense vessel or unexpected calcification.

Skull: Normal. Negative for fracture or focal lesion.

Sinuses/Orbits: No acute finding.

Other: None.
IMPRESSION: 1. No acute intracranial abnormality identified.
2. Few nonspecific foci of hypoattenuation in subcortical white
matter probably representing mild chronic microvascular ischemic
changes.

By: Cozlovschi Frekautan M.D.

## 2023-04-20 ENCOUNTER — Other Ambulatory Visit: Payer: Self-pay | Admitting: Internal Medicine

## 2023-04-20 DIAGNOSIS — Z0000 Encounter for general adult medical examination without abnormal findings: Secondary | ICD-10-CM

## 2023-04-20 DIAGNOSIS — E782 Mixed hyperlipidemia: Secondary | ICD-10-CM

## 2023-04-29 ENCOUNTER — Other Ambulatory Visit: Payer: Self-pay

## 2023-05-10 ENCOUNTER — Inpatient Hospital Stay: Admission: RE | Admit: 2023-05-10 | Payer: Self-pay | Source: Ambulatory Visit
